# Patient Record
Sex: Male | Born: 1995 | Race: White | Hispanic: No | Marital: Single | Smoking: Never smoker
Health system: Southern US, Community
[De-identification: ages and names within clinical notes are randomized; demographics above are authoritative.]

---
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
## 2018-09-27 DIAGNOSIS — H6123 Impacted cerumen, bilateral: Secondary | ICD-10-CM | POA: Diagnosis not present

## 2018-09-27 DIAGNOSIS — R509 Fever, unspecified: Secondary | ICD-10-CM | POA: Diagnosis not present

## 2018-10-12 DIAGNOSIS — G4489 Other headache syndrome: Secondary | ICD-10-CM | POA: Diagnosis not present

## 2018-10-12 DIAGNOSIS — R509 Fever, unspecified: Secondary | ICD-10-CM | POA: Diagnosis not present

## 2018-10-28 ENCOUNTER — Other Ambulatory Visit: Payer: Self-pay

## 2018-10-28 DIAGNOSIS — Z20822 Contact with and (suspected) exposure to covid-19: Secondary | ICD-10-CM

## 2018-11-01 LAB — NOVEL CORONAVIRUS, NAA: SARS-CoV-2, NAA: NOT DETECTED

## 2019-06-28 ENCOUNTER — Other Ambulatory Visit: Payer: Self-pay

## 2019-06-28 ENCOUNTER — Ambulatory Visit
Admission: EM | Admit: 2019-06-28 | Discharge: 2019-06-28 | Disposition: A | Discharge status: Home / Self Care | Payer: BC Managed Care – PPO | Attending: Emergency Medicine | Admitting: Emergency Medicine

## 2019-06-28 ENCOUNTER — Encounter: Payer: Self-pay | Admitting: Emergency Medicine

## 2019-06-28 DIAGNOSIS — M792 Neuralgia and neuritis, unspecified: Secondary | ICD-10-CM

## 2019-06-28 DIAGNOSIS — R1013 Epigastric pain: Secondary | ICD-10-CM | POA: Diagnosis not present

## 2019-06-28 MED ORDER — PANTOPRAZOLE SODIUM 40 MG PO TBEC: 40.0000 mg | DELAYED_RELEASE_TABLET | Freq: Every day | ORAL | 0 refills | Status: AC

## 2019-06-28 MED ORDER — GABAPENTIN 100 MG PO CAPS: 100.0000 mg | ORAL_CAPSULE | Freq: Three times a day (TID) | ORAL | 0 refills | Status: AC

## 2019-06-28 MED ORDER — ALUM & MAG HYDROXIDE-SIMETH 200-200-20 MG/5ML PO SUSP
30.0000 mL | Freq: Once | ORAL | Status: AC
  Administered 2019-06-28: 30 mL via ORAL

## 2019-06-28 NOTE — ED Triage Notes (Signed)
Pt presents with c/o tingling in arms and legs that began on Monday pt also states he developed some chest discomfort and states it feels like heart burn

## 2019-06-28 NOTE — Discharge Instructions (Addendum)
Patient was advised to go to ED if he is having chest pain Symptoms are more consistent with GERD 30 days trial of Protonix was prescribed Gabapentin was prescribed Follow-up with PCP Return for worsening symptoms

## 2019-06-28 NOTE — ED Provider Notes (Signed)
[facility]    CSN: [id number] Arrival date & time: 06/28/19  1219      History   Chief Complaint Chief Complaint  Patient presents with  . Abdominal Pain    HPI [place] is a 24 y.o. male.   Who presented to the urgent care with a complaint of epigastric pain for the past 5 days.  Denies a precipitating event, or specific injury.  Patient localizes pain to his chest and  epigastric area.  Describes it as constant, achy and sharp in character.  Has tried OTC medications without relief.  Nothing made his symptoms worse..  Denies similar symptoms in the past.  Denies fever, chills, appetite change, weight change, chest pain, nausea, vomiting, changes in bowel or bladder habits. He is also reporting tingling and numbness of his bilateral lower and upper extremities while working.  He states he lift multiple boxes at work.  Denies similar symptoms in the past.  Has not tried any medication. Does not have a PCP and has not had a physical in a long time.  Denies confusion, blurred vision, diplopia, back pain.     History reviewed. No pertinent past medical history.  There are no problems to display for this patient.   History reviewed. No pertinent surgical history.     Home Medications    Prior to Admission medications   Medication Sig Start Date End Date Taking? Authorizing Provider  gabapentin (NEURONTIN) 100 MG capsule Take 1 capsule (100 mg total) by mouth 3 (three) times daily for 20 days. 06/28/19 07/18/19  Janiyha Montufar, Darrelyn Hillock, FNP  pantoprazole (PROTONIX) 40 MG tablet Take 1 tablet (40 mg total) by mouth daily. 06/28/19   Farha Dano, Darrelyn Hillock, FNP    Family History Family History  Adopted: Yes    Social History Social History   Tobacco Use  . Smoking status: Never Smoker  . Smokeless tobacco: Never Used  Substance Use Topics  . Alcohol use: Yes  . Drug use: Not Currently     Allergies   Patient has no known allergies.   Review of  Systems Review of Systems  Constitutional: Negative.   Respiratory: Positive for chest tightness.   Cardiovascular: Negative.   Gastrointestinal: Positive for abdominal pain.  Neurological: Positive for numbness.       Tingling  All other systems reviewed and are negative.    Physical Exam Triage Vital Signs ED Triage Vitals  Enc Vitals Group     BP 06/28/19 1235 133/81     Pulse Rate 06/28/19 1235 72     Resp 06/28/19 1235 20     Temp 06/28/19 1235 98.7 F (37.1 C)     Temp src --      SpO2 06/28/19 1235 97 %     Weight --      Height --      Head Circumference --      Peak Flow --      Pain Score 06/28/19 1233 5     Pain Loc --      Pain Edu? --      Excl. in [place]? --    No data found.  Updated Vital Signs BP 133/81   Pulse 72   Temp 98.7 F (37.1 C)   Resp 20   SpO2 97%   Visual Acuity Right Eye Distance:   Left Eye Distance:   Bilateral Distance:    Right Eye Near:   Left Eye Near:    Bilateral Near:  Physical Exam Vitals and nursing note reviewed.  Constitutional:      General: He is not in acute distress.    Appearance: He is well-developed and normal weight. He is not ill-appearing or toxic-appearing.  Cardiovascular:     Rate and Rhythm: Normal rate and regular rhythm.     Pulses: Normal pulses.     Heart sounds: Normal heart sounds. No murmur. No friction rub. No gallop.   Pulmonary:     Effort: Pulmonary effort is normal. No respiratory distress.     Breath sounds: Normal breath sounds. No stridor. No wheezing, rhonchi or rales.  Chest:     Chest wall: No tenderness.  Abdominal:     General: Abdomen is flat. Bowel sounds are normal. There is no distension.     Palpations: Abdomen is soft. There is no mass.     Tenderness: There is abdominal tenderness in the epigastric area. There is no guarding or rebound.     Hernia: No hernia is present.  Neurological:     Mental Status: He is alert and oriented to person, place, and time.      Cranial Nerves: Cranial nerves are intact. No cranial nerve deficit.     Sensory: Sensory deficit present.     Motor: No weakness.     Coordination: Coordination is intact.     Gait: Gait is intact.      UC Treatments / Results  Labs (all labs ordered are listed, but only abnormal results are displayed) Labs Reviewed - No data to display  EKG   Radiology No results found.  Procedures Procedures (including critical care time)  Medications Ordered in UC Medications  alum & mag hydroxide-simeth (MAALOX/MYLANTA) 200-200-20 MG/5ML suspension 30 mL (30 mLs Oral Given 06/28/19 1257)    Initial Impression / Assessment and Plan / UC Course  I have reviewed the triage vital signs and the nursing notes.  Pertinent labs & imaging results that were available during my care of the patient were reviewed by me and considered in my medical decision making (see chart for details).     Patient stable at discharge. EKG was completed and showed normal sinus rhythm GI cocktail was given in office and symptom has been resolved  30-day trial of Protonix will be prescribed Gabapentin will be prescribed for neuropathy symptom Advised patient to follow-up with primary care   Final Clinical Impressions(s) / UC Diagnoses   Final diagnoses:  Epigastric pain  Neuropathic pain     Discharge Instructions     Patient was advised to go to ED if he is having chest pain Symptoms are more consistent with GERD 30 days trial of Protonix was prescribed Gabapentin was prescribed Follow-up with PCP Return for worsening symptoms    ED Prescriptions    Medication Sig Dispense Auth. Provider   pantoprazole (PROTONIX) 40 MG tablet Take 1 tablet (40 mg total) by mouth daily. 30 tablet Jkai Arwood, Zachery Dakins, FNP   gabapentin (NEURONTIN) 100 MG capsule Take 1 capsule (100 mg total) by mouth 3 (three) times daily for 20 days. 60 capsule Renelda Kilian, Zachery Dakins, FNP     PDMP not reviewed this encounter.    Durward Parcel, FNP 06/28/19 1337

## 2019-06-29 DIAGNOSIS — Z0000 Encounter for general adult medical examination without abnormal findings: Secondary | ICD-10-CM | POA: Diagnosis not present

## 2019-06-29 DIAGNOSIS — G4489 Other headache syndrome: Secondary | ICD-10-CM | POA: Diagnosis not present

## 2019-07-05 DIAGNOSIS — Z0000 Encounter for general adult medical examination without abnormal findings: Secondary | ICD-10-CM | POA: Diagnosis not present

## 2019-11-22 DIAGNOSIS — M5430 Sciatica, unspecified side: Secondary | ICD-10-CM | POA: Diagnosis not present

## 2019-11-22 DIAGNOSIS — M545 Low back pain: Secondary | ICD-10-CM | POA: Diagnosis not present

## 2020-02-12 ENCOUNTER — Ambulatory Visit (HOSPITAL_COMMUNITY)
Admission: RE | Admit: 2020-02-12 | Discharge: 2020-02-12 | Disposition: A | Discharge status: Home / Self Care | Payer: BC Managed Care – PPO | Source: Ambulatory Visit | Attending: Internal Medicine | Admitting: Internal Medicine

## 2020-02-12 ENCOUNTER — Other Ambulatory Visit: Payer: Self-pay

## 2020-02-12 ENCOUNTER — Other Ambulatory Visit (HOSPITAL_COMMUNITY): Payer: Self-pay | Admitting: Internal Medicine

## 2020-02-12 DIAGNOSIS — M79641 Pain in right hand: Secondary | ICD-10-CM

## 2020-02-12 DIAGNOSIS — S6991XA Unspecified injury of right wrist, hand and finger(s), initial encounter: Secondary | ICD-10-CM | POA: Diagnosis not present

## 2020-02-12 DIAGNOSIS — M7989 Other specified soft tissue disorders: Secondary | ICD-10-CM | POA: Diagnosis not present

## 2020-02-12 DIAGNOSIS — M25541 Pain in joints of right hand: Secondary | ICD-10-CM | POA: Diagnosis not present

## 2020-02-12 DIAGNOSIS — M545 Low back pain, unspecified: Secondary | ICD-10-CM | POA: Diagnosis not present

## 2020-03-26 DIAGNOSIS — Z1152 Encounter for screening for COVID-19: Secondary | ICD-10-CM | POA: Diagnosis not present

## 2020-03-26 DIAGNOSIS — R0981 Nasal congestion: Secondary | ICD-10-CM | POA: Diagnosis not present

## 2020-03-26 DIAGNOSIS — J029 Acute pharyngitis, unspecified: Secondary | ICD-10-CM | POA: Diagnosis not present

## 2021-03-04 IMAGING — CR DG HAND 2V*R*
2 series · 2 of 2 positions shown · non-contrast
Comparison: None.

CLINICAL DATA: Right hand pain. Fall hitting hand on bed of truck.
Swelling about the fourth metacarpal phalangeal joint.

EXAM:
RIGHT HAND - 2 VIEW

[x pa right]
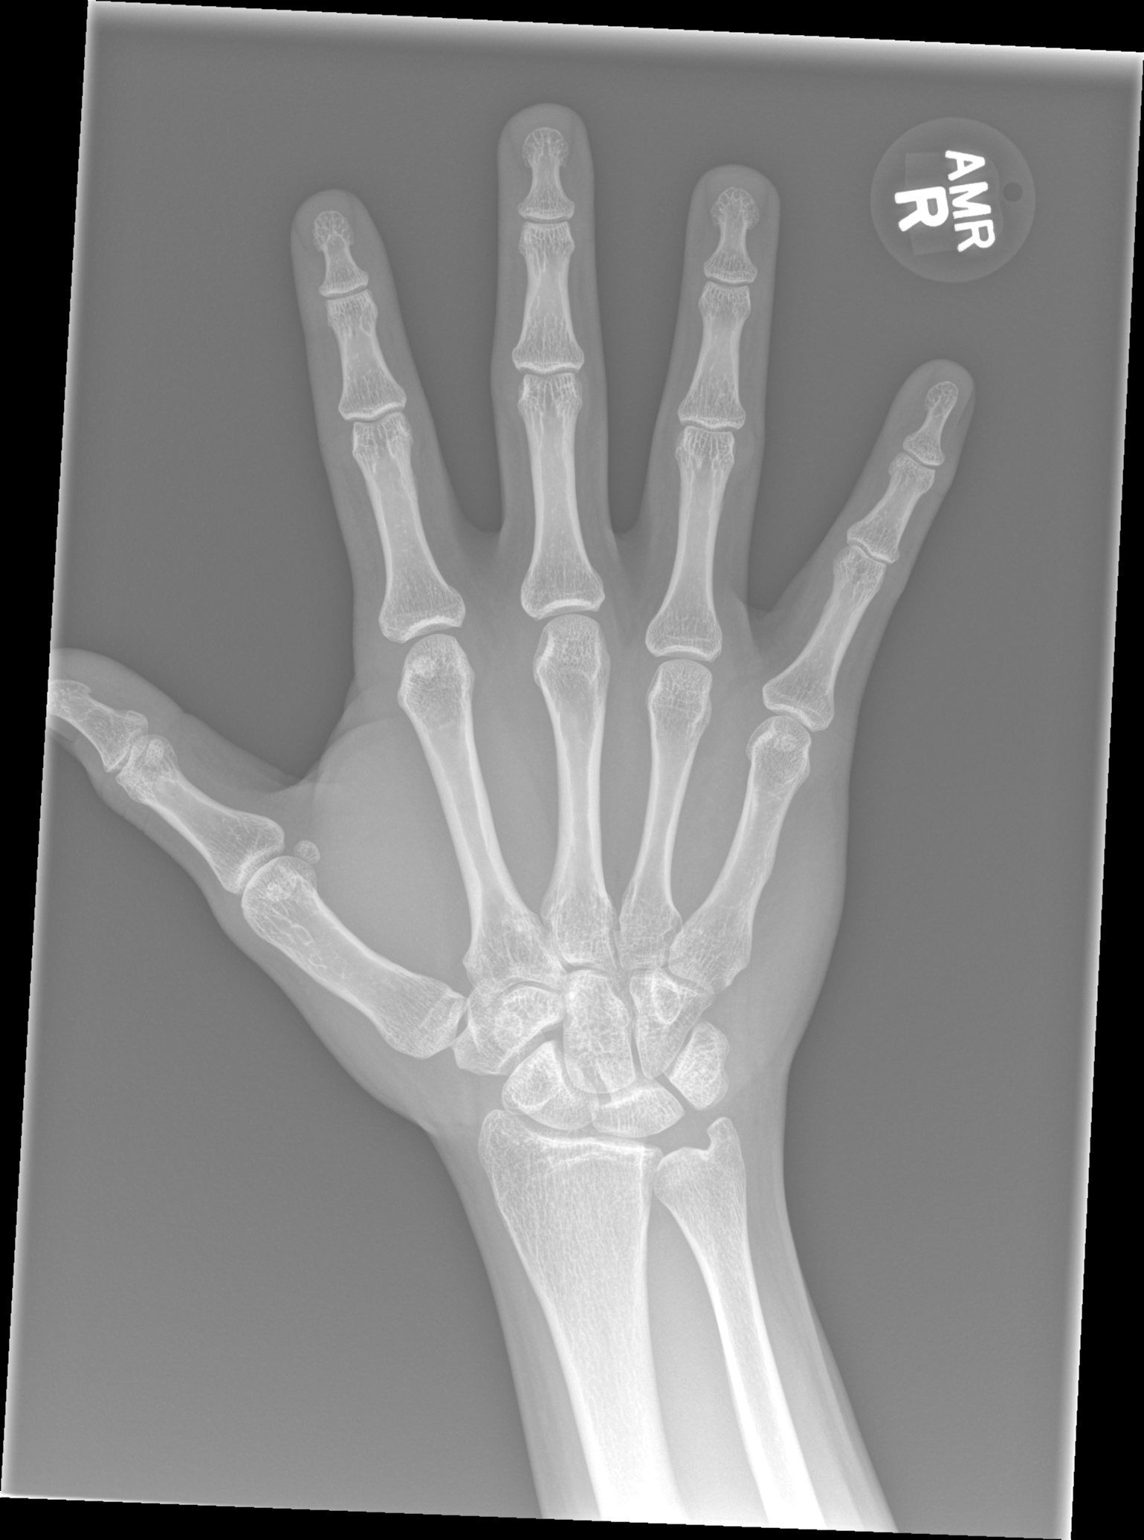

[x hand lat right]
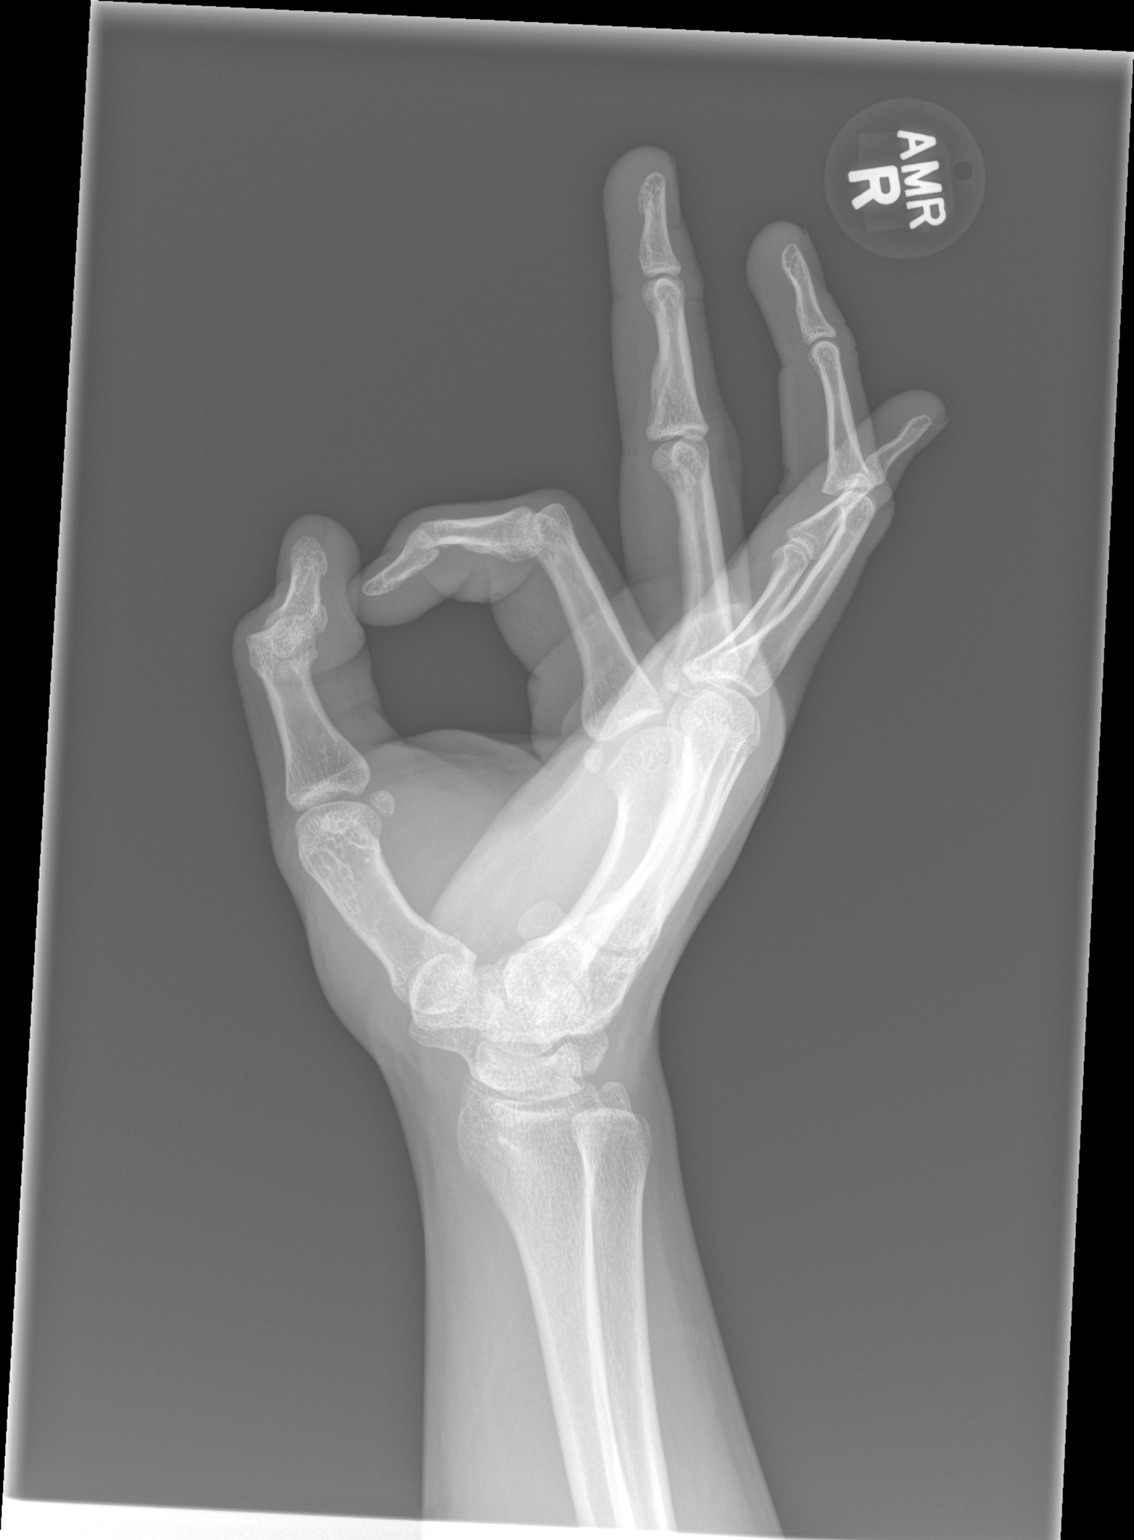

[2 of 2 positions shown; findings below may reference images not displayed]

FINDINGS: Distal aspect of the thumb is excluded from the field of view on the
PA. There is no evidence of fracture or dislocation. There is no
evidence of arthropathy or other focal bone abnormality. Soft
tissues are unremarkable.
IMPRESSION: No fracture or dislocation of the right hand.
